# Patient Record
Sex: Female | Born: 1960 | Race: Black or African American | Hispanic: No | Marital: Married | Smoking: Never smoker
Health system: Southern US, Community
[De-identification: ages and names within clinical notes are randomized; demographics above are authoritative.]

## PROBLEM LIST (undated history)

## (undated) DIAGNOSIS — E119 Type 2 diabetes mellitus without complications: Secondary | ICD-10-CM

## (undated) DIAGNOSIS — I10 Essential (primary) hypertension: Secondary | ICD-10-CM

## (undated) DIAGNOSIS — M549 Dorsalgia, unspecified: Secondary | ICD-10-CM

## (undated) DIAGNOSIS — E7800 Pure hypercholesterolemia, unspecified: Secondary | ICD-10-CM

---

## 2016-06-16 ENCOUNTER — Emergency Department (HOSPITAL_COMMUNITY)
Admission: EM | Admit: 2016-06-16 | Discharge: 2016-06-16 | Disposition: A | Discharge status: Home / Self Care | Payer: BLUE CROSS/BLUE SHIELD | Attending: Emergency Medicine | Admitting: Emergency Medicine

## 2016-06-16 ENCOUNTER — Encounter (HOSPITAL_COMMUNITY): Payer: Self-pay | Admitting: Emergency Medicine

## 2016-06-16 DIAGNOSIS — I10 Essential (primary) hypertension: Secondary | ICD-10-CM | POA: Insufficient documentation

## 2016-06-16 DIAGNOSIS — E119 Type 2 diabetes mellitus without complications: Secondary | ICD-10-CM | POA: Insufficient documentation

## 2016-06-16 DIAGNOSIS — Z7984 Long term (current) use of oral hypoglycemic drugs: Secondary | ICD-10-CM | POA: Insufficient documentation

## 2016-06-16 DIAGNOSIS — Z79899 Other long term (current) drug therapy: Secondary | ICD-10-CM | POA: Insufficient documentation

## 2016-06-16 DIAGNOSIS — K0889 Other specified disorders of teeth and supporting structures: Secondary | ICD-10-CM | POA: Insufficient documentation

## 2016-06-16 HISTORY — DX: Dorsalgia, unspecified: M54.9

## 2016-06-16 HISTORY — DX: Type 2 diabetes mellitus without complications: E11.9

## 2016-06-16 HISTORY — DX: Essential (primary) hypertension: I10

## 2016-06-16 MED ORDER — OXYCODONE-ACETAMINOPHEN 5-325 MG PO TABS
2.0000 | ORAL_TABLET | Freq: Once | ORAL | Status: AC
  Administered 2016-06-16: 2 via ORAL
  Filled 2016-06-16: qty 2

## 2016-06-16 MED ORDER — KETOROLAC TROMETHAMINE 60 MG/2ML IM SOLN
60.0000 mg | Freq: Once | INTRAMUSCULAR | Status: AC
  Administered 2016-06-16: 60 mg via INTRAMUSCULAR
  Filled 2016-06-16: qty 2

## 2016-06-16 MED ORDER — PENICILLIN V POTASSIUM 500 MG PO TABS: 500.0000 mg | ORAL_TABLET | Freq: Three times a day (TID) | ORAL | Status: AC

## 2016-06-16 MED ORDER — PENICILLIN V POTASSIUM 250 MG PO TABS
500.0000 mg | ORAL_TABLET | Freq: Once | ORAL | Status: AC
  Administered 2016-06-16: 500 mg via ORAL
  Filled 2016-06-16: qty 2

## 2016-06-16 MED ORDER — IBUPROFEN 800 MG PO TABS: 800.0000 mg | ORAL_TABLET | Freq: Four times a day (QID) | ORAL | Status: DC

## 2016-06-16 NOTE — ED Provider Notes (Signed)
CSN: [id number]     Arrival date & time 06/16/16  0127 History   First MD Initiated Contact with Patient 06/16/16 0147     Chief Complaint  Patient presents with  . Dental Pain     (Consider location/radiation/quality/duration/timing/severity/associated sxs/prior Treatment) Patient is a 55 y.o. female presenting with tooth pain.  Dental Pain Location:  Lower Lower teeth location:  18/LL 2nd molar Severity:  Mild Duration:  2 days Timing:  Constant Progression:  Worsening Chronicity:  Recurrent Context: dental fracture and poor dentition   Ineffective treatments:  NSAIDs Associated symptoms: facial pain     Past Medical History  Diagnosis Date  . Hypertension   . Diabetes mellitus without complication (HCC)   . Back pain    No past surgical history on file. No family history on file. Social History  Substance Use Topics  . Smoking status: Never Smoker   . Smokeless tobacco: None  . Alcohol Use: No   OB History    No data available     Review of Systems  All other systems reviewed and are negative.     Allergies  Review of patient's allergies indicates not on file.  Home Medications   Prior to Admission medications   Medication Sig Start Date End Date Taking? Authorizing Provider  amLODipine (NORVASC) 10 MG tablet Take 10 mg by mouth daily.   Yes Historical Provider, MD  docusate sodium (COLACE) 100 MG capsule Take 100 mg by mouth 2 (two) times daily.   Yes Historical Provider, MD  ferrous sulfate 325 (65 FE) MG tablet Take 325 mg by mouth daily with breakfast.   Yes Historical Provider, MD  lisinopril-hydrochlorothiazide (PRINZIDE,ZESTORETIC) 10-12.5 MG tablet Take 1 tablet by mouth daily.   Yes Historical Provider, MD  metFORMIN (GLUMETZA) 500 MG (MOD) 24 hr tablet Take 500 mg by mouth 2 (two) times daily with a meal.   Yes Historical Provider, MD  pravastatin (PRAVACHOL) 40 MG tablet Take 40 mg by mouth daily.   Yes Historical Provider, MD  traMADol  (ULTRAM) 50 MG tablet Take 50 mg by mouth 4 (four) times daily.   Yes Historical Provider, MD  ibuprofen (ADVIL,MOTRIN) 800 MG tablet Take 1 tablet (800 mg total) by mouth 4 (four) times daily. 06/16/16   Marily MemosJason Pervis Macintyre, MD  penicillin v potassium (VEETID) 500 MG tablet Take 1 tablet (500 mg total) by mouth 3 (three) times daily. 06/16/16   Barbara CowerJason Asta Corbridge, MD   BP 172/84 mmHg  Pulse 78  Temp(Src) 97.7 F (36.5 C) (Oral)  Resp 20  Ht 5\' 5"  (1.651 m)  Wt 210 lb (95.255 kg)  BMI 34.95 kg/m2  SpO2 100%  LMP 06/05/2016 Physical Exam  Constitutional: She is oriented to person, place, and time. She appears well-developed and well-nourished.  HENT:  Head: Normocephalic and atraumatic.  Cracked 2nd molar on lower left with associated pain, swellig, slight erythema  Neck: Normal range of motion.  Cardiovascular: Normal rate and regular rhythm.   Pulmonary/Chest: Effort normal. No stridor. No respiratory distress. She has no rales.  Abdominal: Soft. She exhibits no distension. There is no tenderness.  Musculoskeletal: She exhibits no edema or tenderness.  Neurological: She is alert and oriented to person, place, and time.  Skin: Skin is warm and dry.  Nursing note and vitals reviewed.   ED Course  Procedures (including critical care time) Labs Review Labs Reviewed - No data to display  Imaging Review No results found. I have personally reviewed and evaluated these  images and lab results as part of my medical decision-making.   EKG Interpretation None      MDM   Final diagnoses:  Pain, dental    Dental fracture pain with questionable early ifnection. No e/o abscess.  Will treat accordingly.  Needs close dental follow up, pt will arrange.   New Prescriptions: New Prescriptions   IBUPROFEN (ADVIL,MOTRIN) 800 MG TABLET    Take 1 tablet (800 mg total) by mouth 4 (four) times daily.   PENICILLIN V POTASSIUM (VEETID) 500 MG TABLET    Take 1 tablet (500 mg total) by mouth 3 (three)  times daily.    I have personally and contemperaneously reviewed labs and imaging and used in my decision making as above.   A medical screening exam was performed and I feel the patient has had an appropriate workup for their chief complaint at this time and likelihood of emergent condition existing is low and thus workup can continue on an outpatient basis.. Their vital signs are stable. They have been counseled on decision, discharge, follow up and which symptoms necessitate immediate return to the emergency department.  They verbally stated understanding and agreement with plan and discharged in stable condition.     Marily Memos, MD 06/16/16 [phone]

## 2016-06-16 NOTE — ED Notes (Signed)
Tooth problems "for awhile" per pt - Last saw dentist "been awhile"- Now with left bottom tooth pain x 3 days

## 2016-07-24 ENCOUNTER — Emergency Department (HOSPITAL_COMMUNITY): Payer: BLUE CROSS/BLUE SHIELD

## 2016-07-24 ENCOUNTER — Encounter (HOSPITAL_COMMUNITY): Payer: Self-pay

## 2016-07-24 ENCOUNTER — Emergency Department (HOSPITAL_COMMUNITY)
Admission: EM | Admit: 2016-07-24 | Discharge: 2016-07-24 | Disposition: A | Discharge status: Home / Self Care | Payer: BLUE CROSS/BLUE SHIELD | Attending: Emergency Medicine | Admitting: Emergency Medicine

## 2016-07-24 DIAGNOSIS — E119 Type 2 diabetes mellitus without complications: Secondary | ICD-10-CM | POA: Diagnosis not present

## 2016-07-24 DIAGNOSIS — M25552 Pain in left hip: Secondary | ICD-10-CM

## 2016-07-24 DIAGNOSIS — I10 Essential (primary) hypertension: Secondary | ICD-10-CM | POA: Insufficient documentation

## 2016-07-24 DIAGNOSIS — M79652 Pain in left thigh: Secondary | ICD-10-CM | POA: Diagnosis present

## 2016-07-24 DIAGNOSIS — Z7984 Long term (current) use of oral hypoglycemic drugs: Secondary | ICD-10-CM | POA: Diagnosis not present

## 2016-07-24 DIAGNOSIS — Z79899 Other long term (current) drug therapy: Secondary | ICD-10-CM | POA: Diagnosis not present

## 2016-07-24 HISTORY — DX: Pure hypercholesterolemia, unspecified: E78.00

## 2016-07-24 MED ORDER — HYDROCODONE-ACETAMINOPHEN 5-325 MG PO TABS: ORAL_TABLET | ORAL | 0 refills | Status: DC

## 2016-07-24 MED ORDER — NAPROXEN 500 MG PO TABS: 500.0000 mg | ORAL_TABLET | Freq: Two times a day (BID) | ORAL | 0 refills | Status: AC

## 2016-07-24 MED ORDER — MORPHINE SULFATE (PF) 4 MG/ML IV SOLN: 4.0000 mg | Freq: Once | INTRAVENOUS | Status: DC

## 2016-07-24 MED ORDER — ONDANSETRON HCL 4 MG/2ML IJ SOLN: 4.0000 mg | Freq: Once | INTRAMUSCULAR | Status: DC

## 2016-07-24 MED ORDER — HYDROCODONE-ACETAMINOPHEN 5-325 MG PO TABS
1.0000 | ORAL_TABLET | Freq: Once | ORAL | Status: AC
  Administered 2016-07-24: 1 via ORAL
  Filled 2016-07-24: qty 1

## 2016-07-24 NOTE — Discharge Instructions (Signed)
Alternate ice and heat to your hip when possible.  Follow-up with your doctor or with the orthopedic doctor listed

## 2016-07-24 NOTE — ED Notes (Signed)
Pt to Xray.

## 2016-07-24 NOTE — ED Triage Notes (Signed)
Pt reports pain in left thigh with standing since yesterday.  Denies injury.  Reports no pain when sitting or laying down.

## 2016-07-26 NOTE — ED Provider Notes (Signed)
[facility]-EMER[facility]ENCY DEPT Provider Note   CSN: [id number] Arrival date & time: 07/24/16  1037     History   Chief Complaint Chief Complaint  Patient presents with  . Leg Pain    HPI Diamond Medina is a 55 y.o. female.  HPI   Diamond Medina is a 55 y.o. female who presents to the Emergency Department complaining of left hip and thigh pain.  She states the pain began one day prior to arrival.  She reports a sharp pain from her posterior hip that radiates into her anterior thigh.  Pain is only present upon weight bearing, resolves at rest.  She denies kown injury, but reports that she stands and walks all day at her job.  She has tried OTC pain medication without relief.  She denies dysuria, urine or bowel changes, abdominal pain, fever, back pain and swelling   Past Medical History:  Diagnosis Date  . Back pain   . Diabetes mellitus without complication (HCC)   . Hypercholesterolemia   . Hypertension     There are no active problems to display for this patient.   History reviewed. No pertinent surgical history.  OB History    No data available       Home Medications    Prior to Admission medications   Medication Sig Start Date End Date Taking? Authorizing Provider  amLODipine (NORVASC) 10 MG tablet Take 10 mg by mouth daily.   Yes Historical Provider, MD  docusate sodium (COLACE) 100 MG capsule Take 100 mg by mouth 2 (two) times daily.   Yes Historical Provider, MD  ferrous sulfate 325 (65 FE) MG tablet Take 325 mg by mouth daily with breakfast.   Yes Historical Provider, MD  lisinopril-hydrochlorothiazide (PRINZIDE,ZESTORETIC) 10-12.5 MG tablet Take 1 tablet by mouth daily.   Yes Historical Provider, MD  metFORMIN (GLUMETZA) 500 MG (MOD) 24 hr tablet Take 500 mg by mouth 2 (two) times daily with a meal.   Yes Historical Provider, MD  pravastatin (PRAVACHOL) 40 MG tablet Take 40 mg by mouth daily.   Yes Historical Provider, MD  traMADol (ULTRAM) 50 MG tablet  Take 50 mg by mouth 4 (four) times daily.   Yes Historical Provider, MD  HYDROcodone-acetaminophen (NORCO/VICODIN) 5-325 MG tablet Take one tab po q 4-6 hrs prn pain 07/24/16   Lakeith Careaga, PA-C  naproxen (NAPROSYN) 500 MG tablet Take 1 tablet (500 mg total) by mouth 2 (two) times daily with a meal. 07/24/16   [place] Stegemann, PA-C  penicillin v potassium (VEETID) 500 MG tablet Take 1 tablet (500 mg total) by mouth 3 (three) times daily. Patient not taking: Reported on 07/24/2016 06/16/16   Marily Memos, MD    Family History No family history on file.  Social History Social History  Substance Use Topics  . Smoking status: Never Smoker  . Smokeless tobacco: Never Used  . Alcohol use No     Allergies   Review of patient's allergies indicates no known allergies.   Review of Systems Review of Systems  Constitutional: Negative for chills and fever.  Gastrointestinal: Negative for abdominal pain, nausea and vomiting.  Genitourinary: Negative for difficulty urinating and dysuria.  Musculoskeletal: Positive for arthralgias (left hip and thigh pain) and joint swelling.  Skin: Negative for color change and wound.  Neurological: Negative for weakness and numbness.  All other systems reviewed and are negative.    Physical Exam Updated Vital Signs BP 138/75 (BP Location: Right Arm)   Pulse 67   Temp  98.1 F (36.7 C) (Oral)   Resp 18   Ht 5\' 2"  (1.575 m)   Wt 90.7 kg   LMP 07/09/2016   SpO2 100%   BMI 36.58 kg/m   Physical Exam  Constitutional: She is oriented to person, place, and time. She appears well-developed and well-nourished. No distress.  HENT:  Head: Normocephalic and atraumatic.  Neck: Normal range of motion. Neck supple.  Cardiovascular: Normal rate, regular rhythm, normal heart sounds and intact distal pulses.   No murmur heard. Pulmonary/Chest: Effort normal and breath sounds normal. No respiratory distress.  Abdominal: Soft. She exhibits no distension. There is  no tenderness.  Musculoskeletal: She exhibits tenderness. She exhibits no edema.       Lumbar back: She exhibits tenderness and pain. She exhibits normal range of motion, no swelling, no deformity, no laceration and normal pulse.  Tender to lateral and anterior left hip.  Pain reproduced with SLR on left.  No spinal tenderness. No edema or erythema of the joint.  DP pulses are brisk and symmetrical.  Distal sensation intact.  Pt has 5/5 strength against resistance of bilateral lower extremities.     Neurological: She is alert and oriented to person, place, and time. She has normal strength. No sensory deficit. She exhibits normal muscle tone. Coordination and gait normal.  Reflex Scores:      Patellar reflexes are 2+ on the right side and 2+ on the left side.      Achilles reflexes are 2+ on the right side and 2+ on the left side. Skin: Skin is warm and dry. No rash noted.  Nursing note and vitals reviewed.    ED Treatments / Results  Labs (all labs ordered are listed, but only abnormal results are displayed) Labs Reviewed - No data to display  EKG  EKG Interpretation None       Radiology Dg Hip Unilat W Or Wo Pelvis 2-3 Views Left  Result Date: 07/24/2016 CLINICAL DATA:  Left hip pain for 2 days, no known injury EXAM: DG HIP (WITH OR WITHOUT PELVIS) 2-3V LEFT COMPARISON:  None. FINDINGS: Three views of the left hip submitted. No acute fracture or subluxation. Degenerative changes are noted bilateral hip joints with superior acetabular spurring. Mild degenerative changes pubic symphysis. IMPRESSION: No acute fracture or subluxation. Degenerative changes as described above. Electronically Signed   By: Natasha MeadLiviu  Pop M.D.   On: 07/24/2016 13:02    Procedures Procedures (including critical care time)  Medications Ordered in ED Medications  HYDROcodone-acetaminophen (NORCO/VICODIN) 5-325 MG per tablet 1 tablet (1 tablet Oral Given 07/24/16 1244)     Initial Impression / Assessment and  Plan / ED Course  I have reviewed the triage vital signs and the nursing notes.  Pertinent labs & imaging results that were available during my care of the patient were reviewed by me and considered in my medical decision making (see chart for details).  Clinical Course    Pt is well appearing, no concerning sx's for septic joint.  NV intact,.  Ambulates with a steady gait.  Likely musculoskeletal.  Pt appears stable for d/c and agrees to PMD f/u, orthopedic referral given.    Final Clinical Impressions(s) / ED Diagnoses   Final diagnoses:  Hip pain, acute, left    New Prescriptions Discharge Medication List as of 07/24/2016  2:17 PM    START taking these medications   Details  HYDROcodone-acetaminophen (NORCO/VICODIN) 5-325 MG tablet Take one tab po q 4-6 hrs prn pain, Print  naproxen (NAPROSYN) 500 MG tablet Take 1 tablet (500 mg total) by mouth 2 (two) times daily with a meal., Starting Tue 07/24/2016, Print         Quirino Kakos Medora, PA-C 07/26/16 0831    Samuel Jester, DO 07/28/16 1538

## 2016-07-31 ENCOUNTER — Ambulatory Visit (INDEPENDENT_AMBULATORY_CARE_PROVIDER_SITE_OTHER): Payer: BLUE CROSS/BLUE SHIELD | Admitting: Orthopaedic Surgery

## 2016-07-31 ENCOUNTER — Encounter: Payer: Self-pay | Admitting: Orthopaedic Surgery

## 2016-07-31 ENCOUNTER — Ambulatory Visit (INDEPENDENT_AMBULATORY_CARE_PROVIDER_SITE_OTHER): Payer: BLUE CROSS/BLUE SHIELD

## 2016-07-31 VITALS — BP 115/79 | HR 69 | Ht 61.5 in | Wt 198.0 lb

## 2016-07-31 DIAGNOSIS — M5442 Lumbago with sciatica, left side: Secondary | ICD-10-CM

## 2016-07-31 DIAGNOSIS — I10 Essential (primary) hypertension: Secondary | ICD-10-CM

## 2016-07-31 DIAGNOSIS — M25552 Pain in left hip: Secondary | ICD-10-CM

## 2016-07-31 MED ORDER — PREDNISONE 5 MG (21) PO TBPK: ORAL_TABLET | ORAL | 0 refills | Status: AC

## 2016-07-31 MED ORDER — OXYCODONE-ACETAMINOPHEN 5-325 MG PO TABS: 1.0000 | ORAL_TABLET | Freq: Four times a day (QID) | ORAL | 0 refills | Status: DC | PRN

## 2016-07-31 MED ORDER — NAPROXEN 500 MG PO TABS: ORAL_TABLET | ORAL | 5 refills | Status: AC

## 2016-07-31 NOTE — Progress Notes (Signed)
Subjective: My left hip hurts    Patient ID: Diamond Medina, female    DOB: 12-Aug-1961, 55 y.o.   MRN: [id number]  HPI She has had pain in the left hip for about ten days to two weeks.  She went to the ER on August 22nd because of the hip pain.  X-rays were done.  They showed: FINDINGS: Three views of the left hip submitted. No acute fracture or subluxation. Degenerative changes are noted bilateral hip joints with superior acetabular spurring. Mild degenerative changes pubic symphysis. IMPRESSION: No acute fracture or subluxation. Degenerative changes as described Above.  She was given Naprosyn and pain medicine which did not help much.  She is using a cane now.  Her pain goes down the right left to below the knee.  She has no tenderness of the hip laterally.  She has no redness.  She has problems walking or bending but sitting and laying down does not hurt.  She denies any trauma.  She has tried heat, ice, rubs with no help.   Review of Systems  HENT: Negative for congestion.   Respiratory: Negative for cough and shortness of breath.   Cardiovascular: Negative for chest pain and leg swelling.  Endocrine: Positive for cold intolerance.  Musculoskeletal: Positive for arthralgias, back pain and gait problem.  Allergic/Immunologic: Positive for environmental allergies.   Past Medical History:  Diagnosis Date  . Back pain   . Diabetes mellitus without complication (HCC)   . Hypercholesterolemia   . Hypertension     History reviewed. No pertinent surgical history.  Current Outpatient Prescriptions on File Prior to Visit  Medication Sig Dispense Refill  . amLODipine (NORVASC) 10 MG tablet Take 10 mg by mouth daily.    Marland Kitchen docusate sodium (COLACE) 100 MG capsule Take 100 mg by mouth 2 (two) times daily.    . ferrous sulfate 325 (65 FE) MG tablet Take 325 mg by mouth daily with breakfast.    . HYDROcodone-acetaminophen (NORCO/VICODIN) 5-325 MG tablet Take one tab po q 4-6  hrs prn pain 10 tablet 0  . lisinopril-hydrochlorothiazide (PRINZIDE,ZESTORETIC) 10-12.5 MG tablet Take 1 tablet by mouth daily.    . metFORMIN (GLUMETZA) 500 MG (MOD) 24 hr tablet Take 500 mg by mouth 2 (two) times daily with a meal.    . naproxen (NAPROSYN) 500 MG tablet Take 1 tablet (500 mg total) by mouth 2 (two) times daily with a meal. 20 tablet 0  . penicillin v potassium (VEETID) 500 MG tablet Take 1 tablet (500 mg total) by mouth 3 (three) times daily. (Patient not taking: Reported on 07/24/2016) 21 tablet 0  . pravastatin (PRAVACHOL) 40 MG tablet Take 40 mg by mouth daily.    . traMADol (ULTRAM) 50 MG tablet Take 50 mg by mouth 4 (four) times daily.     No current facility-administered medications on file prior to visit.     Social History   Social History  . Marital status: Married    Spouse name: N/A  . Number of children: N/A  . Years of education: N/A   Occupational History  . Not on file.   Social History Main Topics  . Smoking status: Never Smoker  . Smokeless tobacco: Never Used  . Alcohol use No  . Drug use: No  . Sexual activity: Not on file   Other Topics Concern  . Not on file   Social History Narrative  . No narrative on file    Family History  Problem Relation  Age of Onset  . Diabetes Mother     BP 115/79   Pulse 69   Ht 5' 1.5" (1.562 m)   Wt 198 lb (89.8 kg)   LMP 07/09/2016   BMI 36.81 kg/m      Objective:   Physical Exam  Constitutional: She is oriented to person, place, and time. She appears well-developed and well-nourished.  HENT:  Head: Normocephalic and atraumatic.  Eyes: Conjunctivae and EOM are normal. Pupils are equal, round, and reactive to light.  Neck: Normal range of motion. Neck supple.  Cardiovascular: Normal rate, regular rhythm and intact distal pulses.   Pulmonary/Chest: Effort normal.  Abdominal: Soft.  Musculoskeletal: She exhibits tenderness (Full motion of the back and can touch toes, but pain when bends  laterally to the left, tightness left lower back, SLR negative.  Gait limp to the left.  ROM of left hip full.  No redness.  No weakness.).  Neurological: She is alert and oriented to person, place, and time. She displays normal reflexes. No cranial nerve deficit. She exhibits normal muscle tone. Coordination normal.  Skin: Skin is warm and dry.  Psychiatric: She has a normal mood and affect. Her behavior is normal. Judgment and thought content normal.  Vitals reviewed.   X-rays were done of the lower back, reported separately.      Assessment & Plan:   Encounter Diagnoses  Name Primary?  . Left-sided low back pain with left-sided sciatica Yes  . Left hip pain   . 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  I will set her up for PT.  Return in two weeks.  Medicine will be given.  Call if any problem.  Do not take Naprosyn while on prednisone.  Precautions discussed.  Electronically Signed Darreld McleanWayne Keila Turan, MD 8/29/20172:13 PM

## 2016-08-14 ENCOUNTER — Ambulatory Visit: Payer: BLUE CROSS/BLUE SHIELD | Admitting: Orthopaedic Surgery

## 2016-08-21 ENCOUNTER — Ambulatory Visit (INDEPENDENT_AMBULATORY_CARE_PROVIDER_SITE_OTHER): Payer: BLUE CROSS/BLUE SHIELD | Admitting: Orthopaedic Surgery

## 2016-08-21 ENCOUNTER — Encounter: Payer: Self-pay | Admitting: Orthopaedic Surgery

## 2016-08-21 VITALS — BP 144/81 | HR 73 | Temp 97.5°F | Ht 61.0 in | Wt 196.0 lb

## 2016-08-21 DIAGNOSIS — I10 Essential (primary) hypertension: Secondary | ICD-10-CM

## 2016-08-21 DIAGNOSIS — M5442 Lumbago with sciatica, left side: Secondary | ICD-10-CM

## 2016-08-21 DIAGNOSIS — M25552 Pain in left hip: Secondary | ICD-10-CM | POA: Diagnosis not present

## 2016-08-21 MED ORDER — OXYCODONE-ACETAMINOPHEN 5-325 MG PO TABS
1.0000 | ORAL_TABLET | Freq: Four times a day (QID) | ORAL | 0 refills | Status: DC | PRN
Start: 2016-08-21 — End: 2016-09-18

## 2016-08-21 NOTE — Progress Notes (Signed)
Patient EA:VWUJWJXBJY:Diamond Medina, female DOB:03-12-1961, 55 y.o. NWG:[id number]  Chief Complaint  Patient presents with  . Follow-up    LEFT SIDED SCIATICA    HPI  Diamond MelnickJacqueline Yakel is a 55 y.o. female who has left sided sciatica.  She is better. She has less pain. [place] in [place]  I have the notes and have reviewed them.  She has no new trauma.  She has some slight pain of the left anterior left more medially.  She is taking her medicine and doing her exercises. HPI  Body mass index is 37.03 kg/m.  ROS  Review of Systems  HENT: Negative for congestion.   Respiratory: Negative for cough and shortness of breath.   Cardiovascular: Negative for chest pain and leg swelling.  Endocrine: Positive for cold intolerance.  Musculoskeletal: Positive for arthralgias, back pain and gait problem.  Allergic/Immunologic: Positive for environmental allergies.    Past Medical History:  Diagnosis Date  . Back pain   . Diabetes mellitus without complication (HCC)   . Hypercholesterolemia   . Hypertension     No past surgical history on file.  Family History  Problem Relation Age of Onset  . Diabetes Mother     Social History Social History  Substance Use Topics  . Smoking status: Never Smoker  . Smokeless tobacco: Never Used  . Alcohol use No    No Known Allergies  Current Outpatient Prescriptions  Medication Sig Dispense Refill  . amLODipine (NORVASC) 10 MG tablet Take 10 mg by mouth daily.    Marland Kitchen. atorvastatin (LIPITOR) 10 MG tablet Take 10 mg by mouth daily.    Marland Kitchen. docusate sodium (COLACE) 100 MG capsule Take 100 mg by mouth 2 (two) times daily.    . ferrous sulfate 325 (65 FE) MG tablet Take 325 mg by mouth daily with breakfast.    . lisinopril-hydrochlorothiazide (PRINZIDE,ZESTORETIC) 10-12.5 MG tablet Take 1 tablet by mouth daily.    . metFORMIN (GLUMETZA) 500 MG (MOD) 24 hr tablet Take 500 mg by mouth 2 (two) times daily with a meal.    . naproxen  (NAPROSYN) 500 MG tablet Take 1 tablet (500 mg total) by mouth 2 (two) times daily with a meal. 20 tablet 0  . naproxen (NAPROSYN) 500 MG tablet Do not take with the prednisone.  One tablet twice a day after eating. 60 tablet 5  . oxyCODONE-acetaminophen (PERCOCET/ROXICET) 5-325 MG tablet Take 1 tablet by mouth every 6 (six) hours as needed for moderate pain or severe pain (Must last 15 days.Do not drive or operate machinery while taking this medicine). 60 tablet 0  . penicillin v potassium (VEETID) 500 MG tablet Take 1 tablet (500 mg total) by mouth 3 (three) times daily. 21 tablet 0  . pravastatin (PRAVACHOL) 40 MG tablet Take 40 mg by mouth daily.    . predniSONE (STERAPRED UNI-PAK 21 TAB) 5 MG (21) TBPK tablet Take 6 pills first day; 5 pills second day; 4 pills third day; 3 pills fourth day; 2 pills next day and 1 pill last day. 21 tablet 0  . traMADol (ULTRAM) 50 MG tablet Take 50 mg by mouth 4 (four) times daily.     No current facility-administered medications for this visit.      Physical Exam  Blood pressure (!) 144/81, pulse 73, temperature 97.5 F (36.4 C), height 5\' 1"  (1.549 m), weight 196 lb (88.9 kg), last menstrual period 07/09/2016.  Constitutional: overall normal hygiene, normal nutrition, well developed, normal grooming, normal  body habitus. Assistive device:none  Musculoskeletal: gait and station Limp none, muscle tone and strength are normal, no tremors or atrophy is present.  .  Neurological: coordination overall normal.  Deep tendon reflex/nerve stretch intact.  Sensation normal.  Cranial nerves II-XII intact.   Skin:   Normal overall no scars, lesions, ulcers or rashes. No psoriasis.  Psychiatric: Alert and oriented x 3.  Recent memory intact, remote memory unclear.  Normal mood and affect. Well groomed.  Good eye contact.  Cardiovascular: overall no swelling, no varicosities, no edema bilaterally, normal temperatures of the legs and arms, no clubbing, cyanosis  and good capillary refill.  Lymphatic: palpation is normal.  Spine/Pelvis examination:  Inspection:  Overall, sacoiliac joint benign and hips nontender; without crepitus or defects.   Thoracic spine inspection: Alignment normal without kyphosis present   Lumbar spine inspection:  Alignment  with normal lumbar lordosis, without scoliosis apparent.   Thoracic spine palpation:  without tenderness of spinal processes   Lumbar spine palpation: with tenderness of lumbar area; without tightness of lumbar muscles    Range of Motion:   Lumbar flexion, forward flexion is 45 without pain or tenderness    Lumbar extension is 10 without pain or tenderness   Left lateral bend is Normal  without pain or tenderness   Right lateral bend is Normal without pain or tenderness   Straight leg raising is Normal   Strength & tone: Normal   Stability overall normal stability      The patient has been educated about the nature of the problem(s) and counseled on treatment options.  The patient appeared to understand what I have discussed and is in agreement with it.  Encounter Diagnoses  Name Primary?  . Left-sided low back pain with left-sided sciatica Yes  . Left hip pain   . Essential hypertension     PLAN Call if any problems.  Precautions discussed.  Continue current medications.   Return to clinic 1 month Continue PT.   Electronically Signed Darreld Mclean, MD 9/19/201710:31 AM

## 2016-09-18 ENCOUNTER — Ambulatory Visit (INDEPENDENT_AMBULATORY_CARE_PROVIDER_SITE_OTHER): Payer: BLUE CROSS/BLUE SHIELD | Admitting: Orthopaedic Surgery

## 2016-09-18 ENCOUNTER — Encounter: Payer: Self-pay | Admitting: Orthopaedic Surgery

## 2016-09-18 VITALS — BP 133/90 | HR 78 | Temp 97.7°F | Ht 61.75 in | Wt 197.0 lb

## 2016-09-18 DIAGNOSIS — G8929 Other chronic pain: Secondary | ICD-10-CM

## 2016-09-18 DIAGNOSIS — M5442 Lumbago with sciatica, left side: Secondary | ICD-10-CM

## 2016-09-18 DIAGNOSIS — I10 Essential (primary) hypertension: Secondary | ICD-10-CM | POA: Diagnosis not present

## 2016-09-18 MED ORDER — OXYCODONE-ACETAMINOPHEN 5-325 MG PO TABS: 1.0000 | ORAL_TABLET | Freq: Four times a day (QID) | ORAL | 0 refills | Status: DC | PRN

## 2016-09-18 NOTE — Progress Notes (Signed)
Patient Diamond Medina, female DOB:21-Mar-1961, 55 y.o. NFA:[id number]  Chief Complaint  Patient presents with  . Follow-up    Back pain    HPI  Tyronica Truxillo is a 55 y.o. female who has chronic pain of the lumbar spine with left sided paresthesias.  She is not improved with rest, exercises, PT, ice, heat, medicines.  She is worse.  I will get MRI of the lumbar spine. She has pain intense at times to the left anterior lower leg.  She has no new trauma.   HPI  Body mass index is 36.32 kg/m.  ROS  Review of Systems  HENT: Negative for congestion.   Respiratory: Negative for cough and shortness of breath.   Cardiovascular: Negative for chest pain and leg swelling.  Endocrine: Positive for cold intolerance.  Musculoskeletal: Positive for arthralgias, back pain and gait problem.  Allergic/Immunologic: Positive for environmental allergies.    Past Medical History:  Diagnosis Date  . Back pain   . Diabetes mellitus without complication (HCC)   . Hypercholesterolemia   . Hypertension     History reviewed. No pertinent surgical history.  Family History  Problem Relation Age of Onset  . Diabetes Mother     Social History Social History  Substance Use Topics  . Smoking status: Never Smoker  . Smokeless tobacco: Never Used  . Alcohol use No    No Known Allergies  Current Outpatient Prescriptions  Medication Sig Dispense Refill  . amLODipine (NORVASC) 10 MG tablet Take 10 mg by mouth daily.    Marland Kitchen atorvastatin (LIPITOR) 10 MG tablet Take 10 mg by mouth daily.    Marland Kitchen docusate sodium (COLACE) 100 MG capsule Take 100 mg by mouth 2 (two) times daily.    . ferrous sulfate 325 (65 FE) MG tablet Take 325 mg by mouth daily with breakfast.    . lisinopril-hydrochlorothiazide (PRINZIDE,ZESTORETIC) 10-12.5 MG tablet Take 1 tablet by mouth daily.    . metFORMIN (GLUMETZA) 500 MG (MOD) 24 hr tablet Take 500 mg by mouth 2 (two) times daily with a meal.    . naproxen  (NAPROSYN) 500 MG tablet Take 1 tablet (500 mg total) by mouth 2 (two) times daily with a meal. 20 tablet 0  . naproxen (NAPROSYN) 500 MG tablet Do not take with the prednisone.  One tablet twice a day after eating. 60 tablet 5  . oxyCODONE-acetaminophen (PERCOCET/ROXICET) 5-325 MG tablet Take 1 tablet by mouth every 6 (six) hours as needed for moderate pain or severe pain (Must last 15 days.Do not drive or operate machinery while taking this medicine). 60 tablet 0  . penicillin v potassium (VEETID) 500 MG tablet Take 1 tablet (500 mg total) by mouth 3 (three) times daily. 21 tablet 0  . pravastatin (PRAVACHOL) 40 MG tablet Take 40 mg by mouth daily.    . predniSONE (STERAPRED UNI-PAK 21 TAB) 5 MG (21) TBPK tablet Take 6 pills first day; 5 pills second day; 4 pills third day; 3 pills fourth day; 2 pills next day and 1 pill last day. 21 tablet 0  . traMADol (ULTRAM) 50 MG tablet Take 50 mg by mouth 4 (four) times daily.     No current facility-administered medications for this visit.      Physical Exam  Blood pressure 133/90, pulse 78, temperature 97.7 F (36.5 C), height 5' 1.75" (1.568 m), weight 197 lb (89.4 kg).  Constitutional: overall normal hygiene, normal nutrition, well developed, normal grooming, normal body habitus. Assistive device:none  Musculoskeletal: gait  and station Limp none, muscle tone and strength are normal, no tremors or atrophy is present.  .  Neurological: coordination overall normal.  Deep tendon reflex/nerve stretch intact.  Sensation normal.  Cranial nerves II-XII intact.   Skin:   Normal overall no scars, lesions, ulcers or rashes. No psoriasis.  Psychiatric: Alert and oriented x 3.  Recent memory intact, remote memory unclear.  Normal mood and affect. Well groomed.  Good eye contact.  Cardiovascular: overall no swelling, no varicosities, no edema bilaterally, normal temperatures of the legs and arms, no clubbing, cyanosis and good capillary  refill.  Lymphatic: palpation is normal.  Spine/Pelvis examination:  Inspection:  Overall, sacoiliac joint benign and hips nontender; without crepitus or defects.   Thoracic spine inspection: Alignment normal without kyphosis present   Lumbar spine inspection:  Alignment  with normal lumbar lordosis, without scoliosis apparent.   Thoracic spine palpation:  without tenderness of spinal processes   Lumbar spine palpation: with tenderness of lumbar area; without tightness of lumbar muscles    Range of Motion:   Lumbar flexion, forward flexion is 35 without pain or tenderness    Lumbar extension is 10 without pain or tenderness   Left lateral bend is Normal  without pain or tenderness   Right lateral bend is Normal without pain or tenderness   Straight leg raising is Abnormal- on left 30 degrees   Strength & tone: Normal   Stability overall normal stability     The patient has been educated about the nature of the problem(s) and counseled on treatment options.  The patient appeared to understand what I have discussed and is in agreement with it.  Encounter Diagnoses  Name Primary?  . Chronic left-sided low back pain with left-sided sciatica Yes  . Essential hypertension     PLAN Call if any problems.  Precautions discussed.  Continue current medications.   Return to clinic after MRI of the lumbar spine   Electronically Signed Darreld McleanWayne Scotland Korver, MD 10/17/201710:31 AM

## 2016-10-03 ENCOUNTER — Ambulatory Visit (INDEPENDENT_AMBULATORY_CARE_PROVIDER_SITE_OTHER): Payer: BLUE CROSS/BLUE SHIELD | Admitting: Orthopaedic Surgery

## 2016-10-03 ENCOUNTER — Encounter: Payer: Self-pay | Admitting: Orthopaedic Surgery

## 2016-10-03 ENCOUNTER — Telehealth: Payer: Self-pay | Admitting: Orthopaedic Surgery

## 2016-10-03 VITALS — BP 130/73 | HR 72 | Temp 97.5°F | Ht 61.75 in | Wt 197.0 lb

## 2016-10-03 DIAGNOSIS — G8929 Other chronic pain: Secondary | ICD-10-CM

## 2016-10-03 DIAGNOSIS — M5441 Lumbago with sciatica, right side: Secondary | ICD-10-CM

## 2016-10-03 MED ORDER — OXYCODONE-ACETAMINOPHEN 5-325 MG PO TABS: 1.0000 | ORAL_TABLET | Freq: Four times a day (QID) | ORAL | 0 refills | Status: AC | PRN

## 2016-10-03 MED ORDER — TRAMADOL HCL 50 MG PO TABS
50.0000 mg | ORAL_TABLET | Freq: Four times a day (QID) | ORAL | 3 refills | Status: AC | PRN
Start: 2016-10-03 — End: ?

## 2016-10-03 NOTE — Telephone Encounter (Signed)
Patient looked at her bottle and realized that she does need refill  Oxycodone/Acetaminophen  5-325  Mgs.   60   Sig: Take 1 tablet by mouth every 6 (six) hours as needed for moderate pain or severe pain (Must last 15 days.Do not drive or operate machinery while taking this medicine).

## 2016-10-03 NOTE — Progress Notes (Signed)
Patient ZO:XWRUEAVWUJ:Diamond Medina, female DOB:1961-06-08, 55 y.o. WJX:[id number]  Chief Complaint  Patient presents with  . Results    MRI Lumbar Spine    HPI  Diamond Medina is a 55 y.o. female who has lower back pain with sciatica.  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  She has spondylosis at L5-S1 with neural foraminal narrowing on the right with nerve root compromise and possible compression.  She has enlarged uterus.  I will have neurosurgeon see her.  She has right sided sciatica pain today.  HPI  Body mass index is 36.32 kg/m.  ROS  Review of Systems  HENT: Negative for congestion.   Respiratory: Negative for cough and shortness of breath.   Cardiovascular: Negative for chest pain and leg swelling.  Endocrine: Positive for cold intolerance.  Musculoskeletal: Positive for arthralgias, back pain and gait problem.  Allergic/Immunologic: Positive for environmental allergies.    Past Medical History:  Diagnosis Date  . Back pain   . Diabetes mellitus without complication (HCC)   . Hypercholesterolemia   . Hypertension     History reviewed. No pertinent surgical history.  Family History  Problem Relation Age of Onset  . Diabetes Mother     Social History Social History  Substance Use Topics  . Smoking status: Never Smoker  . Smokeless tobacco: Never Used  . Alcohol use No    No Known Allergies  Current Outpatient Prescriptions  Medication Sig Dispense Refill  . amLODipine (NORVASC) 10 MG tablet Take 10 mg by mouth daily.    Marland Kitchen. atorvastatin (LIPITOR) 10 MG tablet Take 10 mg by mouth daily.    Marland Kitchen. docusate sodium (COLACE) 100 MG capsule Take 100 mg by mouth 2 (two) times daily.    . ferrous sulfate 325 (65 FE) MG tablet Take 325 mg by mouth daily with breakfast.    .  lisinopril-hydrochlorothiazide (PRINZIDE,ZESTORETIC) 10-12.5 MG tablet Take 1 tablet by mouth daily.    . metFORMIN (GLUMETZA) 500 MG (MOD) 24 hr tablet Take 500 mg by mouth 2 (two) times daily with a meal.    . naproxen (NAPROSYN) 500 MG tablet Take 1 tablet (500 mg total) by mouth 2 (two) times daily with a meal. 20 tablet 0  . naproxen (NAPROSYN) 500 MG tablet Do not take with the prednisone.  One tablet twice a day after eating. 60 tablet 5  . oxyCODONE-acetaminophen (PERCOCET/ROXICET) 5-325 MG tablet Take 1 tablet by mouth every 6 (six) hours as needed for moderate pain or severe pain (Must last 15 days.Do not drive or operate machinery while taking this medicine). 60 tablet 0  . penicillin v potassium (VEETID) 500 MG tablet Take 1 tablet (500 mg total) by mouth 3 (three) times daily. 21 tablet 0  . pravastatin (PRAVACHOL) 40 MG tablet Take 40 mg by mouth daily.    . predniSONE (STERAPRED UNI-PAK 21 TAB) 5 MG (21) TBPK tablet Take 6 pills first day; 5 pills second day; 4 pills third day; 3 pills fourth day; 2 pills next day and 1 pill last day. 21 tablet 0  . traMADol (ULTRAM) 50 MG tablet Take 50 mg by mouth 4 (four) times daily.    . traMADol (ULTRAM) 50 MG tablet Take 1 tablet (50 mg total) by mouth every 6 (six) hours as needed. 60 tablet 3   No current facility-administered medications for this  visit.      Physical Exam  Blood pressure 130/73, pulse 72, temperature 97.5 F (36.4 C), height 5' 1.75" (1.568 m), weight 197 lb (89.4 kg).  Constitutional: overall normal hygiene, normal nutrition, well developed, normal grooming, normal body habitus. Assistive device:none  Musculoskeletal: gait and station Limp none, muscle tone and strength are normal, no tremors or atrophy is present.  .  Neurological: coordination overall normal.  Deep tendon reflex/nerve stretch intact.  Sensation normal.  Cranial nerves II-XII intact.   Skin:   Normal overall no scars, lesions, ulcers or rashes.  No psoriasis.  Psychiatric: Alert and oriented x 3.  Recent memory intact, remote memory unclear.  Normal mood and affect. Well groomed.  Good eye contact.  Cardiovascular: overall no swelling, no varicosities, no edema bilaterally, normal temperatures of the legs and arms, no clubbing, cyanosis and good capillary refill.  Lymphatic: palpation is normal.  She has pain in the lumbar spine with right sided sciatica and positive SLR on right to 30 degrees, forward flexion 40, extension 5, lateral bends normal.  She has no weakness.  Gait is normal.  The patient has been educated about the nature of the problem(s) and counseled on treatment options.  The patient appeared to understand what I have discussed and is in agreement with it.  Encounter Diagnosis  Name Primary?  . Chronic right-sided low back pain with right-sided sciatica Yes    PLAN Call if any problems.  Precautions discussed.  Continue current medications.   Return to clinic to neurosurgeon   Electronically Signed Darreld McleanWayne Randi Poullard, MD 11/1/201710:54 AM

## 2016-10-04 IMAGING — DX DG HIP (WITH OR WITHOUT PELVIS) 2-3V*L*
3 series · 3 of 3 positions shown · non-contrast
Comparison: None.

CLINICAL DATA: Left hip pain for 2 days, no known injury

EXAM:
DG HIP (WITH OR WITHOUT PELVIS) 2-3V LEFT

[pelvis ap]
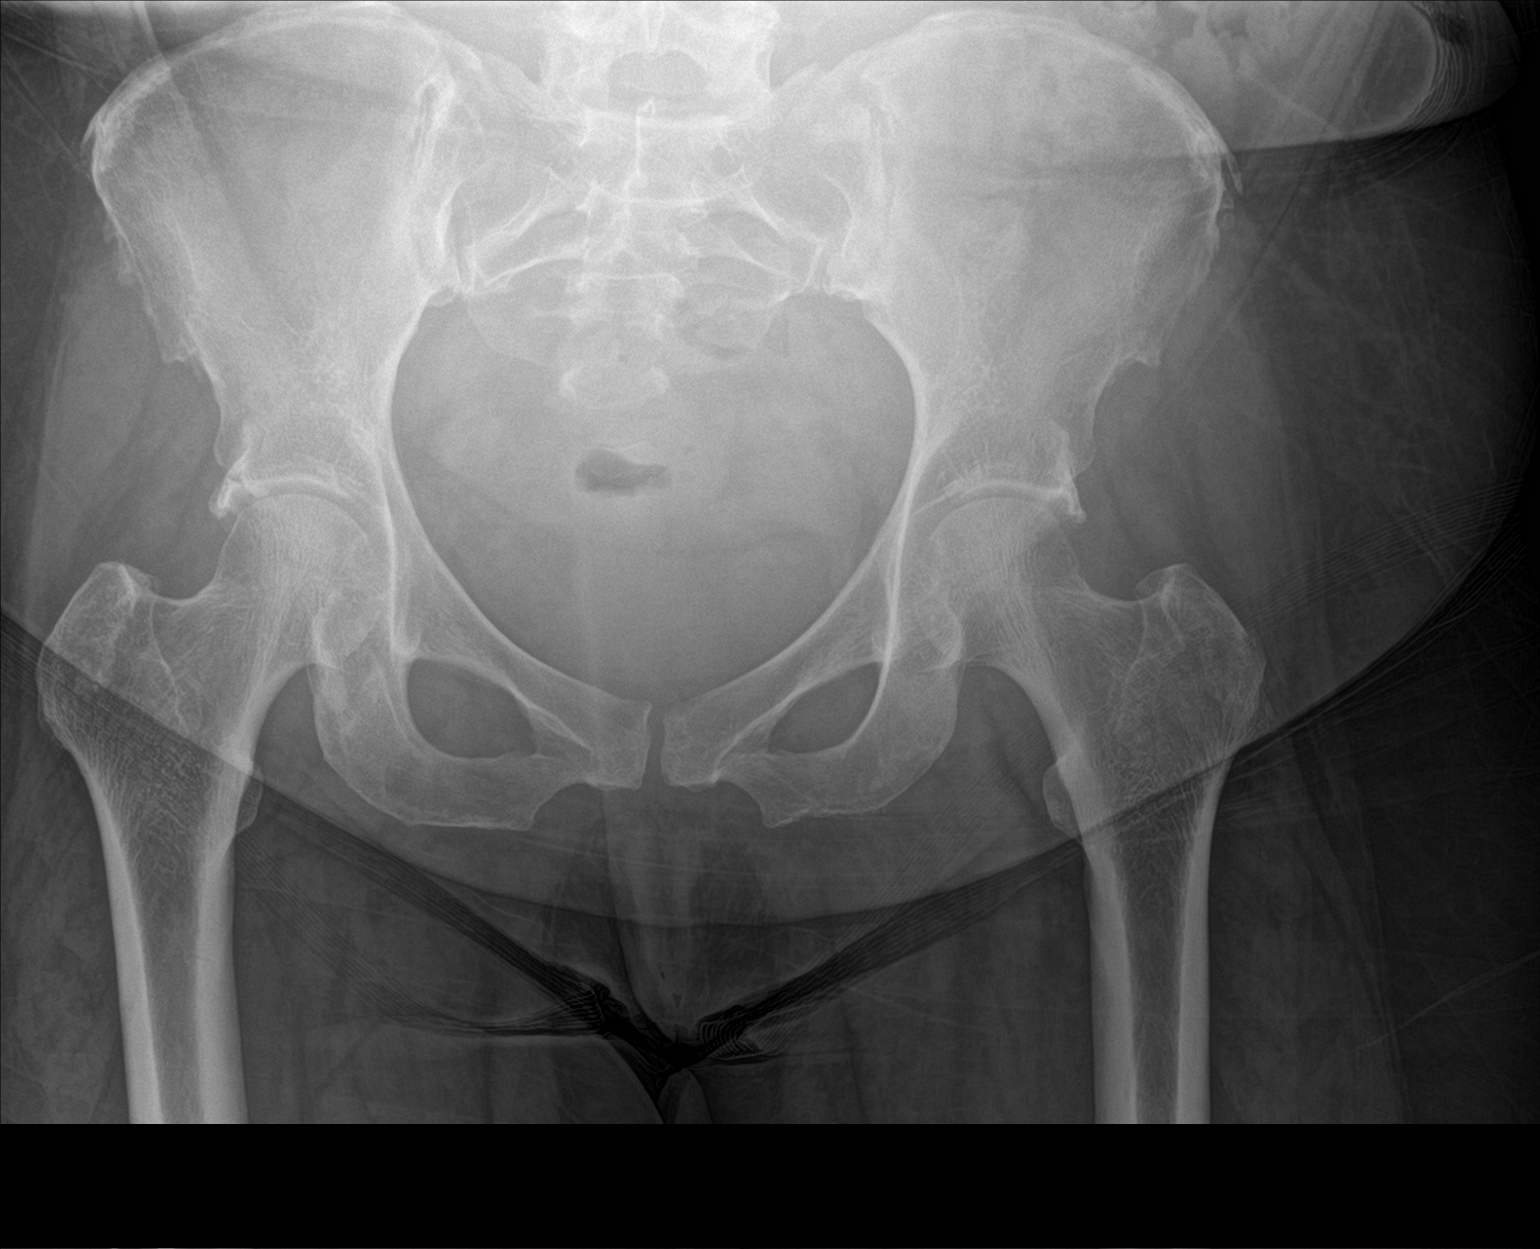

[hip ap]
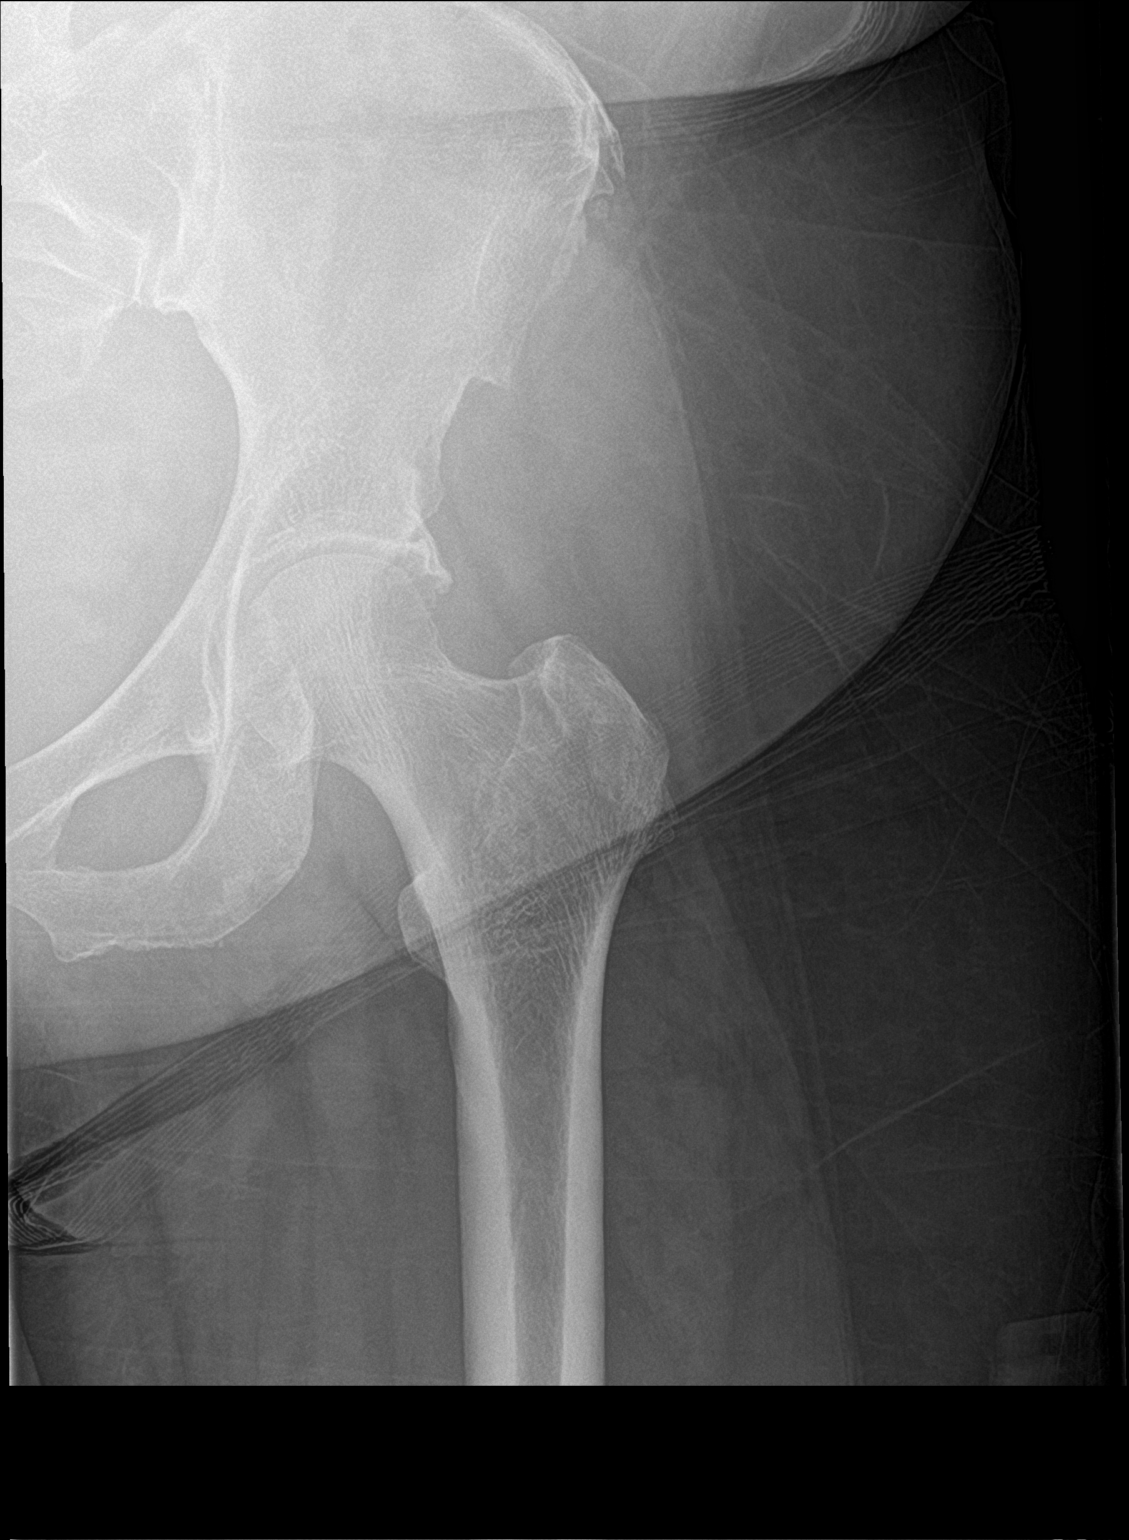

[hip lat]
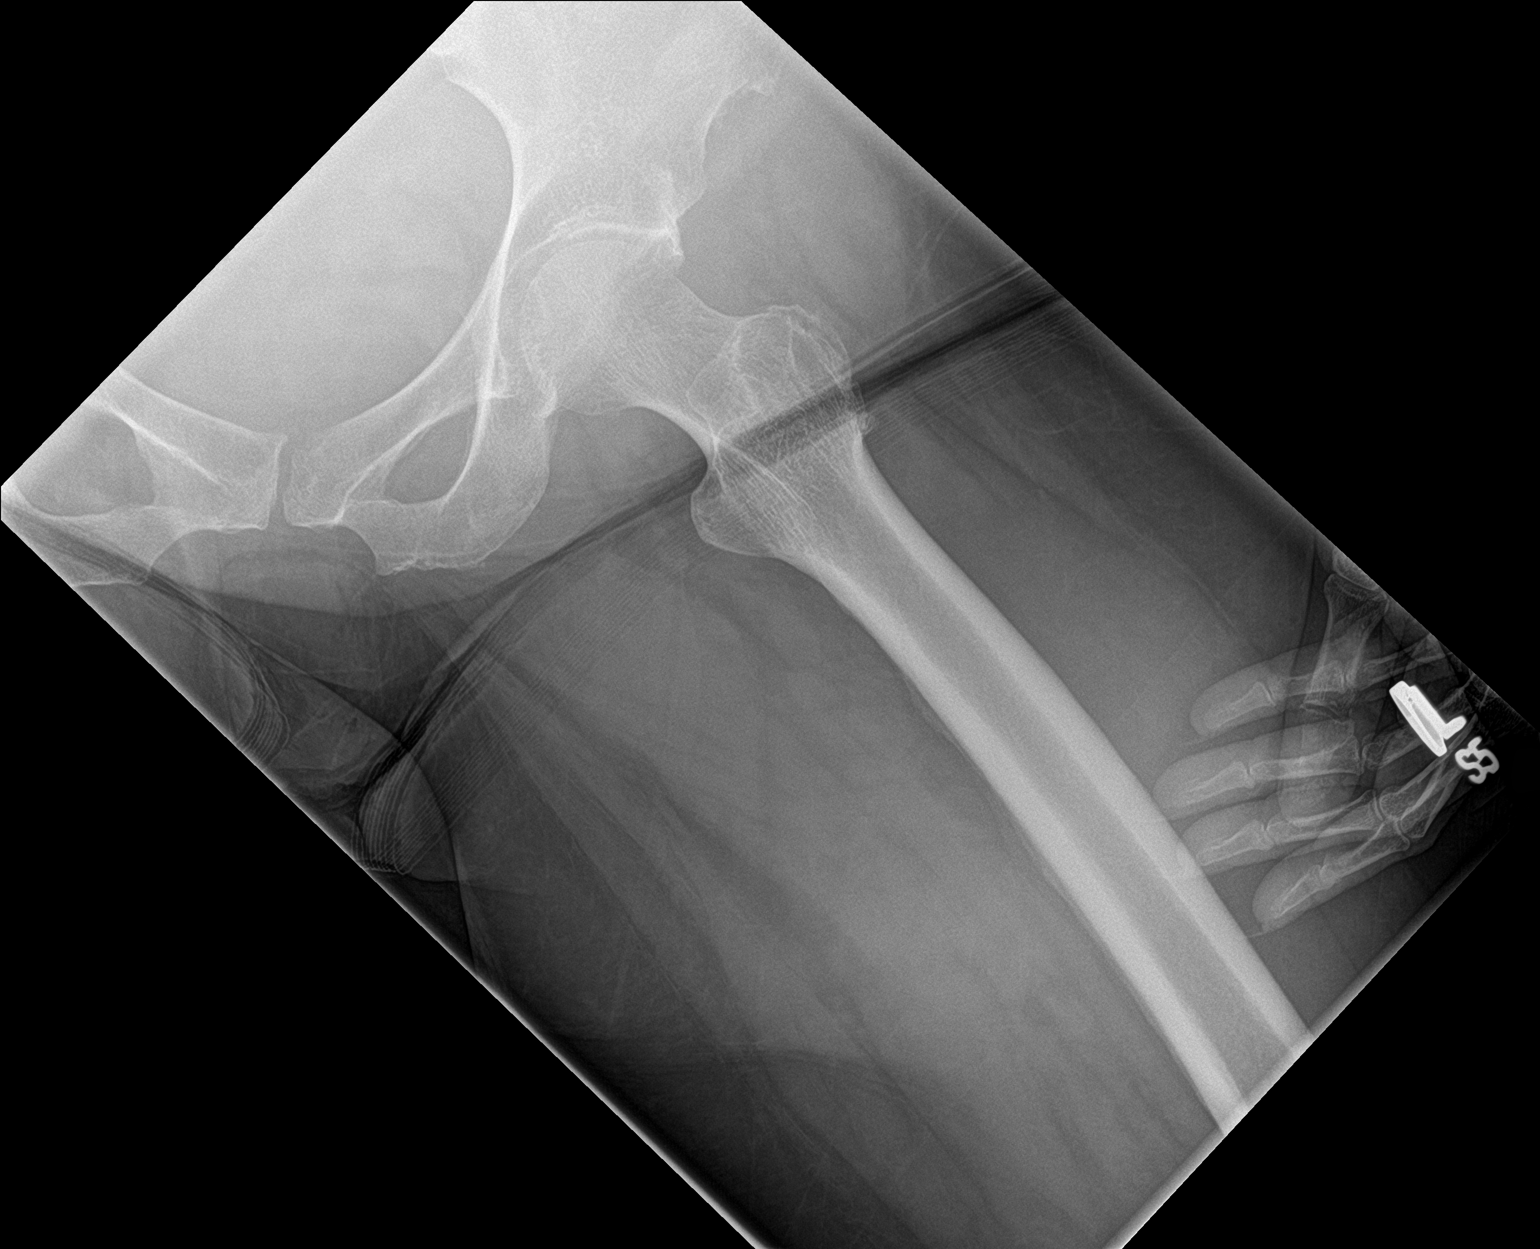

[3 of 3 positions shown; findings below may reference images not displayed]

FINDINGS: Three views of the left hip submitted. No acute fracture or
subluxation. Degenerative changes are noted bilateral hip joints
with superior acetabular spurring. Mild degenerative changes pubic
symphysis.
IMPRESSION: No acute fracture or subluxation. Degenerative changes as described
above.
